# Patient Record
Sex: Female | Born: 1970 | Race: White | Hispanic: No | Marital: Married | Smoking: Never smoker
Health system: Southern US, Community
[De-identification: ages and names within clinical notes are randomized; demographics above are authoritative.]

## PROBLEM LIST (undated history)

## (undated) DIAGNOSIS — F419 Anxiety disorder, unspecified: Secondary | ICD-10-CM

## (undated) DIAGNOSIS — F32A Depression, unspecified: Secondary | ICD-10-CM

## (undated) HISTORY — PX: ABDOMINAL HYSTERECTOMY: SHX81

## (undated) HISTORY — PX: OTHER SURGICAL HISTORY: SHX169

## (undated) HISTORY — DX: Depression, unspecified: F32.A

## (undated) HISTORY — DX: Anxiety disorder, unspecified: F41.9

## (undated) HISTORY — PX: BREAST ENHANCEMENT SURGERY: SHX7

---

## 2004-06-22 ENCOUNTER — Emergency Department: Payer: Self-pay | Admitting: Emergency Medicine

## 2005-02-27 ENCOUNTER — Observation Stay: Payer: Self-pay | Admitting: Internal Medicine

## 2005-02-27 ENCOUNTER — Ambulatory Visit: Payer: Self-pay | Admitting: Internal Medicine

## 2005-04-18 ENCOUNTER — Ambulatory Visit: Payer: Self-pay | Admitting: Unknown Physician Specialty

## 2005-04-27 ENCOUNTER — Ambulatory Visit: Payer: Self-pay | Admitting: Unknown Physician Specialty

## 2005-05-25 ENCOUNTER — Ambulatory Visit: Payer: Self-pay | Admitting: Unknown Physician Specialty

## 2005-06-25 ENCOUNTER — Ambulatory Visit: Payer: Self-pay | Admitting: Unknown Physician Specialty

## 2005-07-25 ENCOUNTER — Ambulatory Visit: Payer: Self-pay | Admitting: Unknown Physician Specialty

## 2005-08-25 ENCOUNTER — Ambulatory Visit: Payer: Self-pay | Admitting: Unknown Physician Specialty

## 2006-09-10 ENCOUNTER — Ambulatory Visit: Payer: Self-pay | Admitting: Unknown Physician Specialty

## 2006-09-25 ENCOUNTER — Ambulatory Visit: Payer: Self-pay | Admitting: Unknown Physician Specialty

## 2006-10-26 ENCOUNTER — Ambulatory Visit: Payer: Self-pay | Admitting: Unknown Physician Specialty

## 2008-03-17 ENCOUNTER — Ambulatory Visit: Payer: Self-pay

## 2008-04-08 ENCOUNTER — Ambulatory Visit: Payer: Self-pay

## 2009-01-11 ENCOUNTER — Ambulatory Visit: Payer: Self-pay

## 2009-01-12 ENCOUNTER — Ambulatory Visit: Payer: Self-pay

## 2010-11-03 ENCOUNTER — Ambulatory Visit: Payer: Self-pay

## 2014-03-27 DIAGNOSIS — F3342 Major depressive disorder, recurrent, in full remission: Secondary | ICD-10-CM | POA: Insufficient documentation

## 2015-03-28 HISTORY — PX: AUGMENTATION MAMMAPLASTY: SUR837

## 2017-03-27 HISTORY — PX: AUGMENTATION MAMMAPLASTY: SUR837

## 2018-03-08 DIAGNOSIS — M5412 Radiculopathy, cervical region: Secondary | ICD-10-CM | POA: Insufficient documentation

## 2018-03-08 DIAGNOSIS — Z0000 Encounter for general adult medical examination without abnormal findings: Secondary | ICD-10-CM | POA: Insufficient documentation

## 2018-03-08 DIAGNOSIS — E8941 Symptomatic postprocedural ovarian failure: Secondary | ICD-10-CM | POA: Insufficient documentation

## 2019-03-14 DIAGNOSIS — F411 Generalized anxiety disorder: Secondary | ICD-10-CM | POA: Insufficient documentation

## 2019-04-21 DIAGNOSIS — E7849 Other hyperlipidemia: Secondary | ICD-10-CM | POA: Insufficient documentation

## 2020-01-28 ENCOUNTER — Other Ambulatory Visit: Payer: Self-pay

## 2020-01-30 ENCOUNTER — Other Ambulatory Visit: Payer: Self-pay

## 2020-01-30 ENCOUNTER — Ambulatory Visit (INDEPENDENT_AMBULATORY_CARE_PROVIDER_SITE_OTHER): Payer: Commercial Managed Care - PPO

## 2020-01-30 ENCOUNTER — Ambulatory Visit: Payer: Commercial Managed Care - PPO | Admitting: Nurse Practitioner

## 2020-01-30 ENCOUNTER — Encounter: Payer: Self-pay | Admitting: Nurse Practitioner

## 2020-01-30 VITALS — BP 130/80 | HR 89 | Temp 98.1°F | Ht 63.0 in | Wt 122.0 lb

## 2020-01-30 DIAGNOSIS — D229 Melanocytic nevi, unspecified: Secondary | ICD-10-CM | POA: Diagnosis not present

## 2020-01-30 DIAGNOSIS — M25551 Pain in right hip: Secondary | ICD-10-CM | POA: Diagnosis not present

## 2020-01-30 DIAGNOSIS — Z8371 Family history of colonic polyps: Secondary | ICD-10-CM | POA: Diagnosis not present

## 2020-01-30 DIAGNOSIS — G8929 Other chronic pain: Secondary | ICD-10-CM | POA: Diagnosis not present

## 2020-01-30 DIAGNOSIS — Z0000 Encounter for general adult medical examination without abnormal findings: Secondary | ICD-10-CM

## 2020-01-30 DIAGNOSIS — Z114 Encounter for screening for human immunodeficiency virus [HIV]: Secondary | ICD-10-CM

## 2020-01-30 DIAGNOSIS — Z1231 Encounter for screening mammogram for malignant neoplasm of breast: Secondary | ICD-10-CM

## 2020-01-30 LAB — B12 AND FOLATE PANEL
Folate: 13.1 ng/mL (ref >5.9)
Vitamin B-12: 554 pg/mL (ref 211–911)

## 2020-01-30 LAB — COMPREHENSIVE METABOLIC PANEL
ALT: 26 U/L (ref 0–35)
AST: 25 U/L (ref 0–37)
Albumin: 4.7 g/dL (ref 3.5–5.2)
Alkaline Phosphatase: 87 U/L (ref 39–117)
BUN: 20 mg/dL (ref 6–23)
CO2: 27 mEq/L (ref 19–32)
Calcium: 9.9 mg/dL (ref 8.4–10.5)
Chloride: 103 mEq/L (ref 96–112)
Creatinine, Ser: 1.06 mg/dL (ref 0.40–1.20)
GFR: 61.59 mL/min (ref >60.00)
Glucose, Bld: 75 mg/dL (ref 70–99)
Potassium: 4.5 mEq/L (ref 3.5–5.1)
Sodium: 140 mEq/L (ref 135–145)
Total Bilirubin: 0.5 mg/dL (ref 0.2–1.2)
Total Protein: 7.3 g/dL (ref 6.0–8.3)

## 2020-01-30 LAB — CBC WITH DIFFERENTIAL/PLATELET
Basophils Absolute: 0 10*3/uL (ref 0.0–0.1)
Basophils Relative: 0.7 % (ref 0.0–3.0)
Eosinophils Absolute: 0.2 10*3/uL (ref 0.0–0.7)
Eosinophils Relative: 3 % (ref 0.0–5.0)
HCT: 43.1 % (ref 36.0–46.0)
Hemoglobin: 14.2 g/dL (ref 12.0–15.0)
Lymphocytes Relative: 30.1 % (ref 12.0–46.0)
Lymphs Abs: 1.6 10*3/uL (ref 0.7–4.0)
MCHC: 33 g/dL (ref 30.0–36.0)
MCV: 91.5 fl (ref 78.0–100.0)
Monocytes Absolute: 0.5 10*3/uL (ref 0.1–1.0)
Monocytes Relative: 9 % (ref 3.0–12.0)
Neutro Abs: 3 10*3/uL (ref 1.4–7.7)
Neutrophils Relative %: 57.2 % (ref 43.0–77.0)
Platelets: 336 10*3/uL (ref 150.0–400.0)
RBC: 4.71 Mil/uL (ref 3.87–5.11)
RDW: 12.7 % (ref 11.5–15.5)
WBC: 5.2 10*3/uL (ref 4.0–10.5)

## 2020-01-30 LAB — LIPID PANEL
Cholesterol: 247 mg/dL — ABNORMAL HIGH (ref 0–200)
HDL: 69.9 mg/dL (ref >39.00)
LDL Cholesterol: 162 mg/dL — ABNORMAL HIGH (ref 0–99)
NonHDL: 176.91
Total CHOL/HDL Ratio: 4
Triglycerides: 76 mg/dL (ref 0.0–149.0)
VLDL: 15.2 mg/dL (ref 0.0–40.0)

## 2020-01-30 LAB — HEMOGLOBIN A1C: Hgb A1c MFr Bld: 5.8 % (ref 4.6–6.5)

## 2020-01-30 LAB — VITAMIN D 25 HYDROXY (VIT D DEFICIENCY, FRACTURES): VITD: 32.06 ng/mL (ref 30.00–100.00)

## 2020-01-30 LAB — TSH: TSH: 1.31 u[IU]/mL (ref 0.35–4.50)

## 2020-01-30 NOTE — Patient Instructions (Addendum)
Please go to the lab and x-ray today.  We will call you when all the results are complete.  For your history of as needed clonazepam, we would request that you sign a controlled substance contract with [place].  We would request office visit or video visit at 59-monthintervals for controlled substance prescription refill.  Some patients find that using a substitute for mild anxiety such as hydroxyzine at bedtime can work well.   We can continue to fill your Wellbutrin.   You are eligible for a shingles vaccine, flu vaccine, and Covid vaccine.  We can provide the shingles vaccine and the flu vaccine in the office.  You are up-to-date on your tetanus vaccine.  The Covid vaccine can be given through your pharmacy.  We do recommend that you update these vaccines.  You were counseled regarding the signs of mild Covid and to seek testing if having symptoms.  Please call and schedule your 3D mammogram as discussed. [id number] [place] Right [place]: Since it is not painful, we will just advise you to monitor the response to excess activity with running, squatting, marathon training.  Further recommendations pending x-ray results.    Hip Pain The hip is the joint between the upper legs and the lower pelvis. The bones, cartilage, tendons, and muscles of your hip joint support your body and allow you to move around. Hip pain can range from a minor ache to severe pain in one or both of your hips. The pain may be felt on the inside of the hip joint near the groin, or on the outside near the buttocks and upper thigh. You may also have swelling or stiffness in your hip area. Follow these instructions at home: Managing pain, stiffness, and swelling      If directed, put ice on the painful area. To do this: ? Put ice in a plastic bag. ? Place a towel between your skin and the bag. ? Leave the ice on for 20 minutes, 2-3 times a day.  If  directed, apply heat to the affected area as often as told by your health care provider. Use the heat source that your health care provider recommends, such as a moist heat pack or a heating pad. ? Place a towel between your skin and the heat source. ? Leave the heat on for 20-30 minutes. ? Remove the heat if your skin turns bright red. This is especially important if you are unable to feel pain, heat, or cold. You may have a greater risk of getting burned. Activity  Do exercises as told by your health care provider.  Avoid activities that cause pain. General instructions   Take over-the-counter and prescription medicines only as told by your health care provider.  Keep a journal of your symptoms. Write down: ? How often you have hip pain. ? The location of your pain. ? What the pain feels like. ? What makes the pain worse.  Sleep with a pillow between your legs on your most comfortable side.  Keep all follow-up visits as told by your health care provider. This is important. Contact a health care provider if:  You cannot put weight on your leg.  Your pain or swelling continues or gets worse after one week.  It gets harder to walk.  You have a fever. Get help right away if:  You fall.  You have a sudden increase in pain and swelling in your hip.  Your  hip is red or swollen or very tender to touch. Summary  Hip pain can range from a minor ache to severe pain in one or both of your hips.  The pain may be felt on the inside of the hip joint near the groin, or on the outside near the buttocks and upper thigh.  Avoid activities that cause pain.  Write down how often you have hip pain, the location of the pain, what makes it worse, and what it feels like. This information is not intended to replace advice given to you by your health care provider. Make sure you discuss any questions you have with your health care provider. Document Revised: 07/29/2018 Document Reviewed:  07/29/2018 Elsevier Patient Education  2020 Elsevier Inc.  Preventive Care 71-48 Years Old, Female Preventive care refers to visits with your health care provider and lifestyle choices that can promote health and wellness. This includes:  A yearly physical exam. This may also be called an annual well check.  Regular dental visits and eye exams.  Immunizations.  Screening for certain conditions.  Healthy lifestyle choices, such as eating a healthy diet, getting regular exercise, not using drugs or products that contain nicotine and tobacco, and limiting alcohol use. What can I expect for my preventive care visit? Physical exam Your health care provider will check your:  Height and weight. This may be used to calculate body mass index (BMI), which tells if you are at a healthy weight.  Heart rate and blood pressure.  Skin for abnormal spots. Counseling Your health care provider may ask you questions about your:  Alcohol, tobacco, and drug use.  Emotional well-being.  Home and relationship well-being.  Sexual activity.  Eating habits.  Work and work Statistician.  Method of birth control.  Menstrual cycle.  Pregnancy history. What immunizations do I need?  Influenza (flu) vaccine  This is recommended every year. Tetanus, diphtheria, and pertussis (Tdap) vaccine  You may need a Td booster every 10 years. Varicella (chickenpox) vaccine  You may need this if you have not been vaccinated. Zoster (shingles) vaccine  You may need this after age 65. Measles, mumps, and rubella (MMR) vaccine  You may need at least one dose of MMR if you were born in 1957 or later. You may also need a second dose. Pneumococcal conjugate (PCV13) vaccine  You may need this if you have certain conditions and were not previously vaccinated. Pneumococcal polysaccharide (PPSV23) vaccine  You may need one or two doses if you smoke cigarettes or if you have certain  conditions. Meningococcal conjugate (MenACWY) vaccine  You may need this if you have certain conditions. Hepatitis A vaccine  You may need this if you have certain conditions or if you travel or work in places where you may be exposed to hepatitis A. Hepatitis B vaccine  You may need this if you have certain conditions or if you travel or work in places where you may be exposed to hepatitis B. Haemophilus influenzae type b (Hib) vaccine  You may need this if you have certain conditions. Human papillomavirus (HPV) vaccine  If recommended by your health care provider, you may need three doses over 6 months. You may receive vaccines as individual doses or as more than one vaccine together in one shot (combination vaccines). Talk with your health care provider about the risks and benefits of combination vaccines. What tests do I need? Blood tests  Lipid and cholesterol levels. These may be checked every 5 years, or  more frequently if you are over 68 years old.  Hepatitis C test.  Hepatitis B test. Screening  Lung cancer screening. You may have this screening every year starting at age 4 if you have a 30-pack-year history of smoking and currently smoke or have quit within the past 15 years.  Colorectal cancer screening. All adults should have this screening starting at age 37 and continuing until age 70. Your health care provider may recommend screening at age 71 if you are at increased risk. You will have tests every 1-10 years, depending on your results and the type of screening test.  Diabetes screening. This is done by checking your blood sugar (glucose) after you have not eaten for a while (fasting). You may have this done every 1-3 years.  Mammogram. This may be done every 1-2 years. Talk with your health care provider about when you should start having regular mammograms. This may depend on whether you have a family history of breast cancer.  BRCA-related cancer screening. This  may be done if you have a family history of breast, ovarian, tubal, or peritoneal cancers.  Pelvic exam and Pap test. This may be done every 3 years starting at age 15. Starting at age 71, this may be done every 5 years if you have a Pap test in combination with an HPV test. Other tests  Sexually transmitted disease (STD) testing.  Bone density scan. This is done to screen for osteoporosis. You may have this scan if you are at high risk for osteoporosis. Follow these instructions at home: Eating and drinking  Eat a diet that includes fresh fruits and vegetables, whole grains, lean protein, and low-fat dairy.  Take vitamin and mineral supplements as recommended by your health care provider.  Do not drink alcohol if: ? Your health care provider tells you not to drink. ? You are pregnant, may be pregnant, or are planning to become pregnant.  If you drink alcohol: ? Limit how much you have to 0-1 drink a day. ? Be aware of how much alcohol is in your drink. In the [place] one drink equals one 12 oz bottle of beer (355 mL), one 5 oz glass of wine (148 mL), or one 1 oz glass of hard liquor (44 mL). Lifestyle  Take daily care of your teeth and gums.  Stay active. Exercise for at least 30 minutes on 5 or more days each week.  Do not use any products that contain nicotine or tobacco, such as cigarettes, e-cigarettes, and chewing tobacco. If you need help quitting, ask your health care provider.  If you are sexually active, practice safe sex. Use a condom or other form of birth control (contraception) in order to prevent pregnancy and STIs (sexually transmitted infections).  If told by your health care provider, take low-dose aspirin daily starting at age 62. What's next?  Visit your health care provider once a year for a well check visit.  Ask your health care provider how often you should have your eyes and teeth checked.  Stay up to date on all vaccines. This information is not  intended to replace advice given to you by your health care provider. Make sure you discuss any questions you have with your health care provider. Document Revised: 11/22/2017 Document Reviewed: 11/22/2017 Elsevier Patient Education  2020 [employer].

## 2020-01-30 NOTE — Progress Notes (Signed)
New Patient Office Visit  Subjective:  Patient ID: Ashley Hubbard, female    DOB: 1970-12-28  Age: 49 y.o. MRN: [id number]  CC:  Chief Complaint  Patient presents with  . New Patient (Initial Visit)    establish care   HPI Ashley Hubbard is a 49 yo who prefers to be called,"Ashley Hubbard" and presents to establish care with primary care provider refills of Wellbutrin, clonazepam. She is in need of a CPE and routine screening labs and studies.   Anxiety/Depression: She started on Wellbutrin XL 300 mg when her sister died a few years ago and just has never come off. She tries stopping it in 2017 and went back on it.  She has a lot of stress caring for her aging parents as she is the only child.  She does not want to try coming off Wellbutrin at this time.  She does use clonazepam 0.5 mg once a week or less onset 2019 for airplane flight and then as needed for anxiety. She has never taken it daily and last filled 20 pills in June. [place] controlled substance database PDMP checked and no suspicious activity.    She has a sensation in right hip like a catch. She mentioned this to her past PCP in 2019 and exam was unremarkable. Her brother had bone sarcoma and his leg had gross deformity. She is a runner and notices it after activity and anytime.  She has normal range of motion, no pain, no falls.  She has described this feeling of something extra in the hip. Previously saw PT and did not find home exercise helpful and did not comply.  She has not had the hip x-rayed and would like to do that today.  Further studies with an MRI have been mentioned in 2019.  Patient presents today for complete physical.  Immunizations: Tdap up-to-date 2017.  Declines Covid vaccine.  Declines flu shot. Diet: Healthy Exercise: Patient is a exercises regularly x3-4 times per week and runs Colonoscopy: No prior Dexa: No prior Pap Smear: 2016 and Nl Hysterectomy for dysfunctional uterine bleeding 2009.  She also had  torsion with bilateral oophorectomies- separate surgeries 2015. Mammogram: Performed 2019-NL, overdue Vision: UTD Dentist: UTD Tobacco: smoked socially early adulthood- stopped age 49 Alcohol: a few times a year  Past Medical History:  Diagnosis Date  . Anxiety   . Depression     Past Surgical History:  Procedure Laterality Date  . ABDOMINAL HYSTERECTOMY    . BREAST ENHANCEMENT SURGERY    . tummy tuck      Family History  Problem Relation Age of Onset  . Arthritis Mother   . Heart disease Mother   . Hypertension Mother   . Cancer Father   . Depression Father   . Hearing loss Father   . Stroke Father   . Alcohol abuse Sister   . Early death Sister   . Cancer Brother   . Asthma Paternal Grandmother   . Diabetes Paternal Grandmother   . Stroke Paternal Grandmother   . COPD Paternal Grandfather   . Heart disease Paternal Grandfather   . Heart attack Paternal Grandfather     Social History   Socioeconomic History  . Marital status: Married    Spouse name: Not on file  . Number of children: Not on file  . Years of education: Not on file  . Highest education level: Not on file  Occupational History  . Occupation: Nurse  Tobacco Use  . Smoking  status: Never Smoker  . Smokeless tobacco: Never Used  Vaping Use  . Vaping Use: Never used  Substance and Sexual Activity  . Alcohol use: Not Currently  . Drug use: Never  . Sexual activity: Yes    Partners: Male  Other Topics Concern  . Not on file  Social History Narrative  . Not on file   Social Determinants of Health   Financial Resource Strain:   . Difficulty of Paying Living Expenses: Not on file  Food Insecurity:   . Worried About Charity fundraiser in the Last Year: Not on file  . Ran Out of Food in the Last Year: Not on file  Transportation Needs:   . Lack of Transportation (Medical): Not on file  . Lack of Transportation (Non-Medical): Not on file  Physical Activity:   . Days of Exercise per Week:  Not on file  . Minutes of Exercise per Session: Not on file  Stress:   . Feeling of Stress : Not on file  Social Connections:   . Frequency of Communication with Friends and Family: Not on file  . Frequency of Social Gatherings with Friends and Family: Not on file  . Attends Religious Services: Not on file  . Active Member of Clubs or Organizations: Not on file  . Attends Archivist Meetings: Not on file  . Marital Status: Not on file  Intimate Partner Violence:   . Fear of Current or Ex-Partner: Not on file  . Emotionally Abused: Not on file  . Physically Abused: Not on file  . Sexually Abused: Not on file    Review of Systems  Constitutional: Negative.   HENT: Negative.   Eyes: Negative.   Respiratory: Negative.   Cardiovascular: Negative.   Gastrointestinal: Negative.   Musculoskeletal:       Right hip has a lump feeling-no pain. Runs a few times a week and exorcises with squats.   Allergic/Immunologic: Negative.   Neurological: Negative.   Hematological: Negative for adenopathy. Does not bruise/bleed easily.  Psychiatric/Behavioral: Negative.     Objective:   Today's Vitals: BP 130/80 (BP Location: Left Arm, Patient Position: Sitting, Cuff Size: Normal)   Pulse 89   Temp 98.1 F (36.7 C) (Oral)   Ht 5\' 3"  (1.6 m)   Wt 122 lb (55.3 kg)   SpO2 99%   BMI 21.61 kg/m   Physical Exam Vitals reviewed. Exam conducted with a chaperone present.  Constitutional:      Appearance: She is normal weight.  HENT:     Right Ear: Tympanic membrane, ear canal and external ear normal.     Left Ear: Tympanic membrane and external ear normal.     Mouth/Throat:     Mouth: Mucous membranes are moist.     Pharynx: Oropharynx is clear.  Eyes:     Pupils: Pupils are equal, round, and reactive to light.  Cardiovascular:     Rate and Rhythm: Normal rate and regular rhythm.     Pulses: Normal pulses.     Heart sounds: Normal heart sounds.  Pulmonary:     Effort: Pulmonary  effort is normal.     Breath sounds: Normal breath sounds.  Chest:     Breasts:        Right: No swelling, inverted nipple, mass, skin change or tenderness.        Left: No swelling, inverted nipple, mass or tenderness.     Comments: Bilat breast implants and hx of lift. Healed  scars. Anon tender breasts.  Abdominal:     General: Abdomen is flat.     Palpations: Abdomen is soft.     Tenderness: There is no abdominal tenderness.  Musculoskeletal:        General: Normal range of motion.     Cervical back: Normal range of motion and neck supple.     Right hip: Normal. No deformity, tenderness, bony tenderness or crepitus. Normal range of motion. Normal strength.  Lymphadenopathy:     Cervical: No cervical adenopathy.     Upper Body:     Right upper body: No supraclavicular, axillary or pectoral adenopathy.     Left upper body: No supraclavicular, axillary or pectoral adenopathy.  Skin:    General: Skin is warm and dry.     Comments: Right ear brown nevus, left face cheek irregular brown nevis- reports growing  In size.   Neurological:     General: No focal deficit present.     Mental Status: She is alert and oriented to person, place, and time.  Psychiatric:        Mood and Affect: Mood normal.        Behavior: Behavior normal.        Thought Content: Thought content normal.        Judgment: Judgment normal.     Assessment & Plan:   Problem List Items Addressed This Visit      Other   Screening mammogram for breast cancer - Primary   Relevant Orders   MM 3D SCREEN BREAST BILATERAL   Chronic right hip pain   Relevant Medications   buPROPion (WELLBUTRIN XL) 300 MG 24 hr tablet   clonazePAM (KLONOPIN) 0.5 MG tablet   Other Relevant Orders   DG Hip Unilat W OR W/O Pelvis 2-3 Views Right (Completed)   FH: colon polyps   Relevant Orders   Ambulatory referral to Gastroenterology   Nevus   Relevant Orders   Ambulatory referral to Dermatology    Other Visit Diagnoses     Screening for HIV (human immunodeficiency virus)       Relevant Orders   HIV Antibody (routine testing w rflx) (Completed)      Outpatient Encounter Medications as of 01/30/2020  Medication Sig  . buPROPion (WELLBUTRIN XL) 300 MG 24 hr tablet Take by mouth.  . clonazePAM (KLONOPIN) 0.5 MG tablet Take 0.5 mg by mouth daily as needed.    No facility-administered encounter medications on file as of 01/30/2020.   Please go to the lab and x-ray today.  We will call you when all the results are complete.  For your history of as needed clonazepam, we would request that you sign a controlled substance contract with [place].  We would request office visit or video visit at 81-month intervals for controlled substance prescription refill.  Some patients find that using a substitute for mild anxiety such as hydroxyzine at bedtime can work well.   We can continue to refill your Wellbutrin.   You are eligible for a shingles vaccine, flu vaccine, and Covid vaccine.  We can provide the shingles vaccine and the flu vaccine in the office.  You are up-to-date on your tetanus vaccine.  The Covid vaccine can be given through your pharmacy.  We do recommend that you update these vaccines.  You were counseled regarding the signs of mild Covid and to seek testing if having symptoms.  Please call and schedule your 3D mammogram as discussed. [place]  [address] [place], [place]  Right hip: Since it is not painful, we will just advise you to monitor the response to excess activity with running, squatting, marathon training.  Further recommendations pending x-ray results.  Follow-up: Return in about 3 months (around 05/01/2020).   This visit occurred during the SARS-CoV-2 public health emergency.  Safety protocols were in place, including screening questions prior to the visit, additional usage of staff PPE, and extensive cleaning of exam room while observing appropriate  contact time as indicated for disinfecting solutions.   Denice Paradise, NP

## 2020-02-01 ENCOUNTER — Telehealth: Payer: Self-pay | Admitting: Nurse Practitioner

## 2020-02-01 ENCOUNTER — Encounter: Payer: Self-pay | Admitting: Nurse Practitioner

## 2020-02-01 DIAGNOSIS — M25551 Pain in right hip: Secondary | ICD-10-CM

## 2020-02-01 LAB — HIV ANTIBODY (ROUTINE TESTING W REFLEX): HIV 1&2 Ab, 4th Generation: NONREACTIVE

## 2020-02-01 MED ORDER — BUPROPION HCL ER (XL) 300 MG PO TB24: 300.0000 mg | ORAL_TABLET | Freq: Every day | ORAL | 1 refills | Status: AC

## 2020-02-01 NOTE — Telephone Encounter (Signed)
I LMOM for Ashley Hubbard to call the office for questions about her labs and hip Xray.I sent Ashley Hubbard a My chart note.

## 2020-02-03 ENCOUNTER — Telehealth: Payer: Self-pay | Admitting: Nurse Practitioner

## 2020-02-03 NOTE — Telephone Encounter (Signed)
An enthesophyte is a bony spur forming at a ligament or tendon insertion into the bone. It grows in the direction of the natural pull of the ligament or tendon involved. This is a response to stress. This can be associated with inflammatory arthritis. Treatment is anti-inflammatories, sometimes a cortisone injection, sometimes rest from the activity. Further investigation with MRI of the hip is recommended. I would recommend Rheumatology Consult to check for the presence of inflammatory arthritis that will require different medication if present. Also an Orthopedic consult for MRI and any further recommendations. Referral to RHEUM and ORTHO placed.

## 2020-02-05 ENCOUNTER — Telehealth: Payer: Self-pay

## 2020-02-05 NOTE — Telephone Encounter (Signed)
Contract given to [employer]

## 2020-02-05 NOTE — Telephone Encounter (Signed)
Pt came in to sign non opioid controlled substance form. I checked picture ID and placed in folder up front.

## 2020-02-09 ENCOUNTER — Telehealth (INDEPENDENT_AMBULATORY_CARE_PROVIDER_SITE_OTHER): Payer: Self-pay | Admitting: Gastroenterology

## 2020-02-09 ENCOUNTER — Other Ambulatory Visit: Payer: Self-pay

## 2020-02-09 DIAGNOSIS — Z1211 Encounter for screening for malignant neoplasm of colon: Secondary | ICD-10-CM

## 2020-02-09 DIAGNOSIS — Z83719 Family history of colon polyps, unspecified: Secondary | ICD-10-CM

## 2020-02-09 DIAGNOSIS — Z8371 Family history of colonic polyps: Secondary | ICD-10-CM

## 2020-02-09 MED ORDER — NA SULFATE-K SULFATE-MG SULF 17.5-3.13-1.6 GM/177ML PO SOLN
1.0000 | Freq: Once | ORAL | 0 refills | Status: AC
Start: 2020-02-09 — End: 2020-02-09

## 2020-02-09 NOTE — Progress Notes (Signed)
Gastroenterology Pre-Procedure Review  Request Date: Tuesday 03/23/20 Requesting Physician: Dr. Vicente Males  PATIENT REVIEW QUESTIONS: The patient responded to the following health history questions as indicated:    1. Are you having any GI issues? no 2. Do you have a personal history of Polyps? no 3. Do you have a family history of Colon Cancer or Polyps? yes (FATHER COLON POLYPS) 4. Diabetes Mellitus? no 5. Joint replacements in the past 12 months?no 6. Major health problems in the past 3 months?no 7. Any artificial heart valves, MVP, or defibrillator?no    MEDICATIONS & ALLERGIES:    Patient reports the following regarding taking any anticoagulation/antiplatelet therapy:   Plavix, Coumadin, Eliquis, Xarelto, Lovenox, Pradaxa, Brilinta, or Effient? no Aspirin? no  Patient confirms/reports the following medications:  Current Outpatient Medications  Medication Sig Dispense Refill  . buPROPion (WELLBUTRIN XL) 300 MG 24 hr tablet Take 1 tablet (300 mg total) by mouth daily. 90 tablet 1  . clonazePAM (KLONOPIN) 0.5 MG tablet Take 0.5 mg by mouth daily as needed.     . Na Sulfate-K Sulfate-Mg Sulf 17.5-3.13-1.6 GM/177ML SOLN Take 1 kit by mouth once for 1 dose. 354 mL 0   No current facility-administered medications for this visit.    Patient confirms/reports the following allergies:  Allergies  Allergen Reactions  . Penicillins Rash    Orders Placed This Encounter  Procedures  . Procedural/ Surgical Case Request: COLONOSCOPY WITH PROPOFOL    Standing Status:   Standing    Number of Occurrences:   1    Order Specific Question:   Pre-op diagnosis    Answer:   screening colonoscopy, family history of colon polyps    Order Specific Question:   CPT Code    Answer:   [phone]    AUTHORIZATION INFORMATION Primary Insurance: 1D#: Group #:  Secondary Insurance: 1D#: Group #:  SCHEDULE INFORMATION: Date: Tuesday 03/23/20 Time: Location:[facility]

## 2020-02-25 ENCOUNTER — Telehealth: Payer: Self-pay | Admitting: Nurse Practitioner

## 2020-02-25 MED ORDER — CLONAZEPAM 0.5 MG PO TABS: 0.5000 mg | ORAL_TABLET | Freq: Every day | ORAL | 0 refills | Status: AC | PRN

## 2020-02-25 NOTE — Telephone Encounter (Signed)
Patient never had her clonazePAM (KLONOPIN) 0.5 MG tablet filled and would like it be be filled. Patient came into office and already signed contract.

## 2020-02-25 NOTE — Telephone Encounter (Signed)
Refill for 30 days only.  OFFICE VISIT NEEDED prior to any more refills 

## 2020-03-02 ENCOUNTER — Ambulatory Visit: Payer: Commercial Managed Care - PPO | Admitting: Internal Medicine

## 2020-03-10 ENCOUNTER — Inpatient Hospital Stay
Admission: RE | Admit: 2020-03-10 | Discharge: 2020-03-10 | Disposition: A | Discharge status: Home / Self Care | Payer: Self-pay | Source: Ambulatory Visit | Attending: *Deleted | Admitting: *Deleted

## 2020-03-10 ENCOUNTER — Other Ambulatory Visit: Payer: Self-pay | Admitting: *Deleted

## 2020-03-10 ENCOUNTER — Other Ambulatory Visit: Payer: Self-pay | Admitting: Nurse Practitioner

## 2020-03-10 ENCOUNTER — Other Ambulatory Visit: Payer: Self-pay

## 2020-03-10 ENCOUNTER — Ambulatory Visit
Admission: RE | Admit: 2020-03-10 | Discharge: 2020-03-10 | Disposition: A | Discharge status: Home / Self Care | Payer: Managed Care, Other (non HMO) | Source: Ambulatory Visit | Attending: Nurse Practitioner | Admitting: Nurse Practitioner

## 2020-03-10 DIAGNOSIS — Z1231 Encounter for screening mammogram for malignant neoplasm of breast: Secondary | ICD-10-CM

## 2020-03-19 ENCOUNTER — Other Ambulatory Visit
Admission: RE | Admit: 2020-03-19 | Discharge: 2020-03-19 | Disposition: A | Discharge status: Home / Self Care | Payer: Managed Care, Other (non HMO) | Source: Ambulatory Visit | Attending: Gastroenterology | Admitting: Gastroenterology

## 2020-03-19 ENCOUNTER — Other Ambulatory Visit: Payer: Self-pay

## 2020-03-19 DIAGNOSIS — Z01812 Encounter for preprocedural laboratory examination: Secondary | ICD-10-CM | POA: Insufficient documentation

## 2020-03-19 DIAGNOSIS — Z20822 Contact with and (suspected) exposure to covid-19: Secondary | ICD-10-CM | POA: Insufficient documentation

## 2020-03-19 LAB — SARS CORONAVIRUS 2 (TAT 6-24 HRS): SARS Coronavirus 2: NEGATIVE

## 2020-03-22 ENCOUNTER — Encounter: Payer: Self-pay | Admitting: Gastroenterology

## 2020-03-23 ENCOUNTER — Encounter
Admission: RE | Disposition: A | Discharge status: Home / Self Care | Payer: Self-pay | Source: Home / Self Care | Attending: Gastroenterology

## 2020-03-23 ENCOUNTER — Ambulatory Visit: Payer: Managed Care, Other (non HMO) | Admitting: Certified Registered Nurse Anesthetist

## 2020-03-23 ENCOUNTER — Other Ambulatory Visit: Payer: Self-pay

## 2020-03-23 ENCOUNTER — Ambulatory Visit
Admission: RE | Admit: 2020-03-23 | Discharge: 2020-03-23 | Disposition: A | Discharge status: Home / Self Care | Payer: Managed Care, Other (non HMO) | Attending: Gastroenterology | Admitting: Gastroenterology

## 2020-03-23 ENCOUNTER — Encounter: Payer: Self-pay | Admitting: Gastroenterology

## 2020-03-23 DIAGNOSIS — Z1211 Encounter for screening for malignant neoplasm of colon: Secondary | ICD-10-CM | POA: Insufficient documentation

## 2020-03-23 DIAGNOSIS — Z79899 Other long term (current) drug therapy: Secondary | ICD-10-CM | POA: Insufficient documentation

## 2020-03-23 DIAGNOSIS — Z8371 Family history of colonic polyps: Secondary | ICD-10-CM | POA: Diagnosis not present

## 2020-03-23 HISTORY — PX: COLONOSCOPY WITH PROPOFOL: SHX5780

## 2020-03-23 SURGERY — COLONOSCOPY WITH PROPOFOL
Anesthesia: General

## 2020-03-23 MED ORDER — PROPOFOL 500 MG/50ML IV EMUL
INTRAVENOUS | Status: AC
  Filled 2020-03-23: qty 50

## 2020-03-23 MED ORDER — PROPOFOL 500 MG/50ML IV EMUL
INTRAVENOUS | Status: DC | PRN
  Administered 2020-03-23: 160 ug/kg/min via INTRAVENOUS

## 2020-03-23 MED ORDER — LIDOCAINE HCL (PF) 2 % IJ SOLN
INTRAMUSCULAR | Status: AC
  Filled 2020-03-23: qty 5

## 2020-03-23 MED ORDER — LIDOCAINE HCL (CARDIAC) PF 100 MG/5ML IV SOSY
PREFILLED_SYRINGE | INTRAVENOUS | Status: DC | PRN
  Administered 2020-03-23: 50 mg via INTRAVENOUS

## 2020-03-23 MED ORDER — SODIUM CHLORIDE 0.9 % IV SOLN: INTRAVENOUS | Status: DC

## 2020-03-23 MED ORDER — PROPOFOL 10 MG/ML IV BOLUS
INTRAVENOUS | Status: DC | PRN
Start: 2020-03-23 — End: 2020-03-23
  Administered 2020-03-23: 40 mg via INTRAVENOUS
  Administered 2020-03-23: 60 mg via INTRAVENOUS

## 2020-03-23 NOTE — Anesthesia Preprocedure Evaluation (Signed)
Anesthesia Evaluation  Patient identified by MRN, date of birth, ID band Patient awake    Reviewed: Allergy & Precautions, NPO status , Patient's Chart, lab work & pertinent test results  History of Anesthesia Complications Negative for: history of anesthetic complications  Airway Mallampati: II       Dental   Pulmonary neg sleep apnea, neg COPD, Not current smoker,           Cardiovascular (-) hypertension(-) Past MI and (-) CHF (-) dysrhythmias (-) Valvular Problems/Murmurs     Neuro/Psych neg Seizures Anxiety Depression    GI/Hepatic Neg liver ROS, neg GERD  ,  Endo/Other  neg diabetes  Renal/GU negative Renal ROS     Musculoskeletal   Abdominal   Peds  Hematology   Anesthesia Other Findings   Reproductive/Obstetrics                             Anesthesia Physical Anesthesia Plan  ASA: II  Anesthesia Plan: General   Post-op Pain Management:    Induction: Intravenous  PONV Risk Score and Plan: 3 and Propofol infusion, TIVA and Treatment may vary due to age or medical condition  Airway Management Planned: Nasal Cannula  Additional Equipment:   Intra-op Plan:   Post-operative Plan:   Informed Consent: I have reviewed the patients History and Physical, chart, labs and discussed the procedure including the risks, benefits and alternatives for the proposed anesthesia with the patient or authorized representative who has indicated his/her understanding and acceptance.       Plan Discussed with:   Anesthesia Plan Comments:         Anesthesia Quick Evaluation

## 2020-03-23 NOTE — Progress Notes (Signed)
   03/23/20 0810  Clinical Encounter Type  Visited With Family  Visit Type Initial  Referral From Chaplain  Consult/Referral To Chaplain  While rounding [facility] waiting area, chaplain visited with Pt's husband and he said all is well and that he was doing fine.

## 2020-03-23 NOTE — H&P (Signed)
Wyline Mood, MD [address], [address], [place], [place], [address] [address], [address], [place], [place], [address] Phone: [phone]  Fax: [phone]  Primary Care Physician:  Theadore Nan, NP   Pre-Procedure History & Physical: HPI:  Ashley Hubbard is a 49 y.o. female is here for an colonoscopy.   Past Medical History:  Diagnosis Date  . Anxiety   . Depression     Past Surgical History:  Procedure Laterality Date  . ABDOMINAL HYSTERECTOMY    . AUGMENTATION MAMMAPLASTY Bilateral 2017  . AUGMENTATION MAMMAPLASTY Bilateral 2019   same implants, pt stated she had "breast lift/tissue removed"-has same implants per pt  . BREAST ENHANCEMENT SURGERY    . tummy tuck      Prior to Admission medications   Medication Sig Start Date End Date Taking? Authorizing Provider  buPROPion (WELLBUTRIN XL) 300 MG 24 hr tablet Take 1 tablet (300 mg total) by mouth daily. 02/01/20 07/30/20 Yes Theadore Nan, NP  clonazePAM (KLONOPIN) 0.5 MG tablet Take 1 tablet (0.5 mg total) by mouth daily as needed. 02/25/20  Yes Sherlene Shams, MD    Allergies as of 02/09/2020 - Review Complete 02/09/2020  Allergen Reaction Noted  . Penicillins Rash 06/25/2013    Family History  Problem Relation Age of Onset  . Arthritis Mother   . Heart disease Mother   . Hypertension Mother   . Cancer Father   . Depression Father   . Hearing loss Father   . Stroke Father   . Alcohol abuse Sister   . Early death Sister   . Cancer Brother   . Asthma Paternal Grandmother   . Diabetes Paternal Grandmother   . Stroke Paternal Grandmother   . COPD Paternal Grandfather   . Heart disease Paternal Grandfather   . Heart attack Paternal Grandfather   . Breast cancer Neg Hx     Social History   Socioeconomic History  . Marital status: Married    Spouse name: Not on file  . Number of children: Not on file  . Years of education: Not on file  . Highest education level: Not on file   Occupational History  . Occupation: Nurse  Tobacco Use  . Smoking status: Never Smoker  . Smokeless tobacco: Never Used  Vaping Use  . Vaping Use: Never used  Substance and Sexual Activity  . Alcohol use: Not Currently  . Drug use: Never  . Sexual activity: Yes    Partners: Male  Other Topics Concern  . Not on file  Social History Narrative  . Not on file   Social Determinants of Health   Financial Resource Strain: Not on file  Food Insecurity: Not on file  Transportation Needs: Not on file  Physical Activity: Not on file  Stress: Not on file  Social Connections: Not on file  Intimate Partner Violence: Not on file    Review of Systems: See HPI, otherwise negative ROS  Physical Exam: BP 105/84   Pulse 94   Temp 97.7 F (36.5 C) (Temporal)   Resp 18   Ht 5\' 3"  (1.6 m)   Wt 53.5 kg   SpO2 100%   BMI 20.90 kg/m  General:   Alert,  pleasant and cooperative in NAD Head:  Normocephalic and atraumatic. Neck:  Supple; no masses or thyromegaly. Lungs:  Clear throughout to auscultation, normal respiratory effort.    Heart:  +S1, +S2, Regular rate and rhythm, No edema. Abdomen:  Soft,  nontender and nondistended. Normal bowel sounds, without guarding, and without rebound.   Neurologic:  Alert and  oriented x4;  grossly normal neurologically.  Impression/Plan: Ashley Hubbard is here for an colonoscopy to be performed for Screening colonoscopy with father having colon polyps Risks, benefits, limitations, and alternatives regarding  colonoscopy have been reviewed with the patient.  Questions have been answered.  All parties agreeable.   Jonathon Bellows, MD  03/23/2020, 8:04 AM

## 2020-03-23 NOTE — Op Note (Signed)
[facility] Gastroenterology Patient Name: Ashley Hubbard Procedure Date: 03/23/2020 8:06 AM MRN: [id number] Account #: [id number] Date of Birth: 1970/05/25 Admit Type: Outpatient Age: 49 Room: [facility] ENDO ROOM 2 Gender: Female Note Status: Finalized Procedure:             Colonoscopy Indications:           Colon cancer screening in patient at increased risk:                         Family history of 1st-degree relative with colon polyps Providers:             Jonathon Bellows MD, MD Referring MD:          Luane School. Millsaps (Referring MD) Medicines:             Monitored Anesthesia Care Complications:         No immediate complications. Procedure:             Pre-Anesthesia Assessment:                        - Prior to the procedure, a History and Physical was                         performed, and patient medications, allergies and                         sensitivities were reviewed. The patient's tolerance                         of previous anesthesia was reviewed.                        - The risks and benefits of the procedure and the                         sedation options and risks were discussed with the                         patient. All questions were answered and informed                         consent was obtained.                        - ASA Grade Assessment: II - A patient with mild                         systemic disease.                        After obtaining informed consent, the colonoscope was                         passed under direct vision. Throughout the procedure,                         the patient's blood pressure, pulse, and oxygen                         saturations  were monitored continuously. The                         Colonoscope was introduced through the anus and                         advanced to the the cecum, identified by the                         appendiceal orifice. The colonoscopy was performed                          with ease. The patient tolerated the procedure well.                         The quality of the bowel preparation was excellent. Findings:      The perianal and digital rectal examinations were normal.      The entire examined colon appeared normal on direct and retroflexion       views. Impression:            - The entire examined colon is normal on direct and                         retroflexion views.                        - No specimens collected. Recommendation:        - Discharge patient to home (with escort).                        - Resume previous diet.                        - Continue present medications.                        - Repeat colonoscopy in 5 years for screening purposes. Procedure Code(s):     --- Professional ---                        [id number], Colonoscopy, flexible; diagnostic, including                         collection of specimen(s) by brushing or washing, when                         performed (separate procedure) Diagnosis Code(s):     --- Professional ---                        Z83.71, Family history of colonic polyps CPT copyright 2019 American Medical Association. All rights reserved. The codes documented in this report are preliminary and upon coder review may  be revised to meet current compliance requirements. Wyline Mood, MD Wyline Mood MD, MD 03/23/2020 8:27:34 AM This report has been signed electronically. Number of Addenda: 0 Note Initiated On: 03/23/2020 8:06 AM Scope Withdrawal Time: 0 hours 9 minutes 26 seconds  Total Procedure Duration: 0 hours 15 minutes 7 seconds  Estimated Blood Loss:  Estimated blood loss: none.      [facility]

## 2020-03-23 NOTE — Transfer of Care (Signed)
Immediate Anesthesia Transfer of Care Note  Patient: Ashley Hubbard  Procedure(s) Performed: COLONOSCOPY WITH PROPOFOL (N/A )  Patient Location: PACU  Anesthesia Type:General  Level of Consciousness: drowsy  Airway & Oxygen Therapy: Patient Spontanous Breathing  Post-op Assessment: Report given to RN and Post -op Vital signs reviewed and stable  Post vital signs: Reviewed and stable  Last Vitals:  Vitals Value Taken Time  BP 103/65 03/23/20 0829  Temp 36 C 03/23/20 0828  Pulse    Resp 20 03/23/20 0829  SpO2    Vitals shown include unvalidated device data.  Last Pain:  Vitals:   03/23/20 0828  TempSrc: Temporal  PainSc: Asleep         Complications: No complications documented.

## 2020-03-23 NOTE — Anesthesia Postprocedure Evaluation (Signed)
Anesthesia Post Note  Patient: Ashley Hubbard  Procedure(s) Performed: COLONOSCOPY WITH PROPOFOL (N/A )  Patient location during evaluation: Endoscopy Anesthesia Type: General Level of consciousness: awake and alert Pain management: pain level controlled Vital Signs Assessment: post-procedure vital signs reviewed and stable Respiratory status: spontaneous breathing and respiratory function stable Cardiovascular status: stable Anesthetic complications: no   No complications documented.   Last Vitals:  Vitals:   03/23/20 0700 03/23/20 0828  BP: 105/84 103/65  Pulse: 94 74  Resp: 18   Temp: 36.5 C (!) 36 C  SpO2: 100% 100%    Last Pain:  Vitals:   03/23/20 0848  TempSrc:   PainSc: 0-No pain                 [name]

## 2020-03-24 ENCOUNTER — Encounter: Payer: Self-pay | Admitting: Gastroenterology

## 2020-07-12 ENCOUNTER — Ambulatory Visit: Payer: Commercial Managed Care - PPO | Admitting: Dermatology

## 2020-08-25 ENCOUNTER — Other Ambulatory Visit: Payer: Self-pay

## 2020-09-15 ENCOUNTER — Encounter: Payer: Self-pay | Admitting: Nurse Practitioner

## 2020-09-15 NOTE — Telephone Encounter (Signed)
Patient taking Clonazepam only when flying and for anxiety occasionally last refill was by provider on 03/09/20 for 30 patient requesting refill until appointment with new NP Flinchum on 12/16/20 your last note staed no refills with out appointment.

## 2020-09-15 NOTE — Telephone Encounter (Signed)
Refill denied. The last refill was done as a courtesy and and office visit was needed prior to any additional refills

## 2020-09-15 NOTE — Addendum Note (Signed)
Addended by: Nanci Pina on: 09/15/2020 03:57 PM   Modules accepted: Orders

## 2020-09-15 NOTE — Telephone Encounter (Signed)
Pt called to schedule a TOC appt first available was 12/16/20. She wanted to know if she could havae the Klonopin refilled til then

## 2020-09-16 NOTE — Telephone Encounter (Signed)
Patient aware medication cannot be filled until appointment.

## 2020-12-16 ENCOUNTER — Encounter: Payer: Managed Care, Other (non HMO) | Admitting: Adult Health

## 2021-02-14 ENCOUNTER — Other Ambulatory Visit: Payer: Self-pay | Admitting: Internal Medicine

## 2021-02-14 DIAGNOSIS — Z1231 Encounter for screening mammogram for malignant neoplasm of breast: Secondary | ICD-10-CM

## 2021-03-22 ENCOUNTER — Other Ambulatory Visit: Payer: Self-pay

## 2021-03-22 ENCOUNTER — Ambulatory Visit
Admission: RE | Admit: 2021-03-22 | Discharge: 2021-03-22 | Disposition: A | Discharge status: Home / Self Care | Payer: Managed Care, Other (non HMO) | Source: Ambulatory Visit | Attending: Internal Medicine | Admitting: Internal Medicine

## 2021-03-22 DIAGNOSIS — Z1231 Encounter for screening mammogram for malignant neoplasm of breast: Secondary | ICD-10-CM | POA: Diagnosis present

## 2021-03-29 ENCOUNTER — Other Ambulatory Visit: Payer: Self-pay | Admitting: Internal Medicine

## 2021-03-29 DIAGNOSIS — R928 Other abnormal and inconclusive findings on diagnostic imaging of breast: Secondary | ICD-10-CM

## 2021-03-29 DIAGNOSIS — R921 Mammographic calcification found on diagnostic imaging of breast: Secondary | ICD-10-CM

## 2021-04-08 ENCOUNTER — Other Ambulatory Visit: Payer: Self-pay

## 2021-04-08 ENCOUNTER — Ambulatory Visit
Admission: RE | Admit: 2021-04-08 | Discharge: 2021-04-08 | Disposition: A | Discharge status: Home / Self Care | Payer: Managed Care, Other (non HMO) | Source: Ambulatory Visit | Attending: Internal Medicine | Admitting: Internal Medicine

## 2021-04-08 DIAGNOSIS — R921 Mammographic calcification found on diagnostic imaging of breast: Secondary | ICD-10-CM | POA: Diagnosis present

## 2021-04-08 DIAGNOSIS — R928 Other abnormal and inconclusive findings on diagnostic imaging of breast: Secondary | ICD-10-CM | POA: Insufficient documentation

## 2021-04-11 ENCOUNTER — Other Ambulatory Visit: Payer: Self-pay | Admitting: Internal Medicine

## 2021-04-11 DIAGNOSIS — R921 Mammographic calcification found on diagnostic imaging of breast: Secondary | ICD-10-CM

## 2021-10-07 ENCOUNTER — Ambulatory Visit
Admission: RE | Admit: 2021-10-07 | Discharge: 2021-10-07 | Disposition: A | Discharge status: Home / Self Care | Payer: Managed Care, Other (non HMO) | Source: Ambulatory Visit | Attending: Internal Medicine | Admitting: Internal Medicine

## 2021-10-07 DIAGNOSIS — R921 Mammographic calcification found on diagnostic imaging of breast: Secondary | ICD-10-CM | POA: Insufficient documentation

## 2021-10-12 ENCOUNTER — Other Ambulatory Visit: Payer: Self-pay | Admitting: Internal Medicine

## 2021-10-12 DIAGNOSIS — R921 Mammographic calcification found on diagnostic imaging of breast: Secondary | ICD-10-CM

## 2022-04-11 ENCOUNTER — Ambulatory Visit
Admission: RE | Admit: 2022-04-11 | Discharge: 2022-04-11 | Disposition: A | Discharge status: Home / Self Care | Payer: Managed Care, Other (non HMO) | Source: Ambulatory Visit | Attending: Internal Medicine | Admitting: Internal Medicine

## 2022-04-11 DIAGNOSIS — R921 Mammographic calcification found on diagnostic imaging of breast: Secondary | ICD-10-CM | POA: Diagnosis present

## 2022-10-10 IMAGING — DX DG HIP (WITH OR WITHOUT PELVIS) 2-3V*R*
2 series · 2 of 2 positions shown · non-contrast
Comparison: None.

CLINICAL DATA: Right hip feels lump/catch, runner.

EXAM:
DG HIP (WITH OR WITHOUT PELVIS) 2-3V RIGHT

[ap frog obl (oblique)]
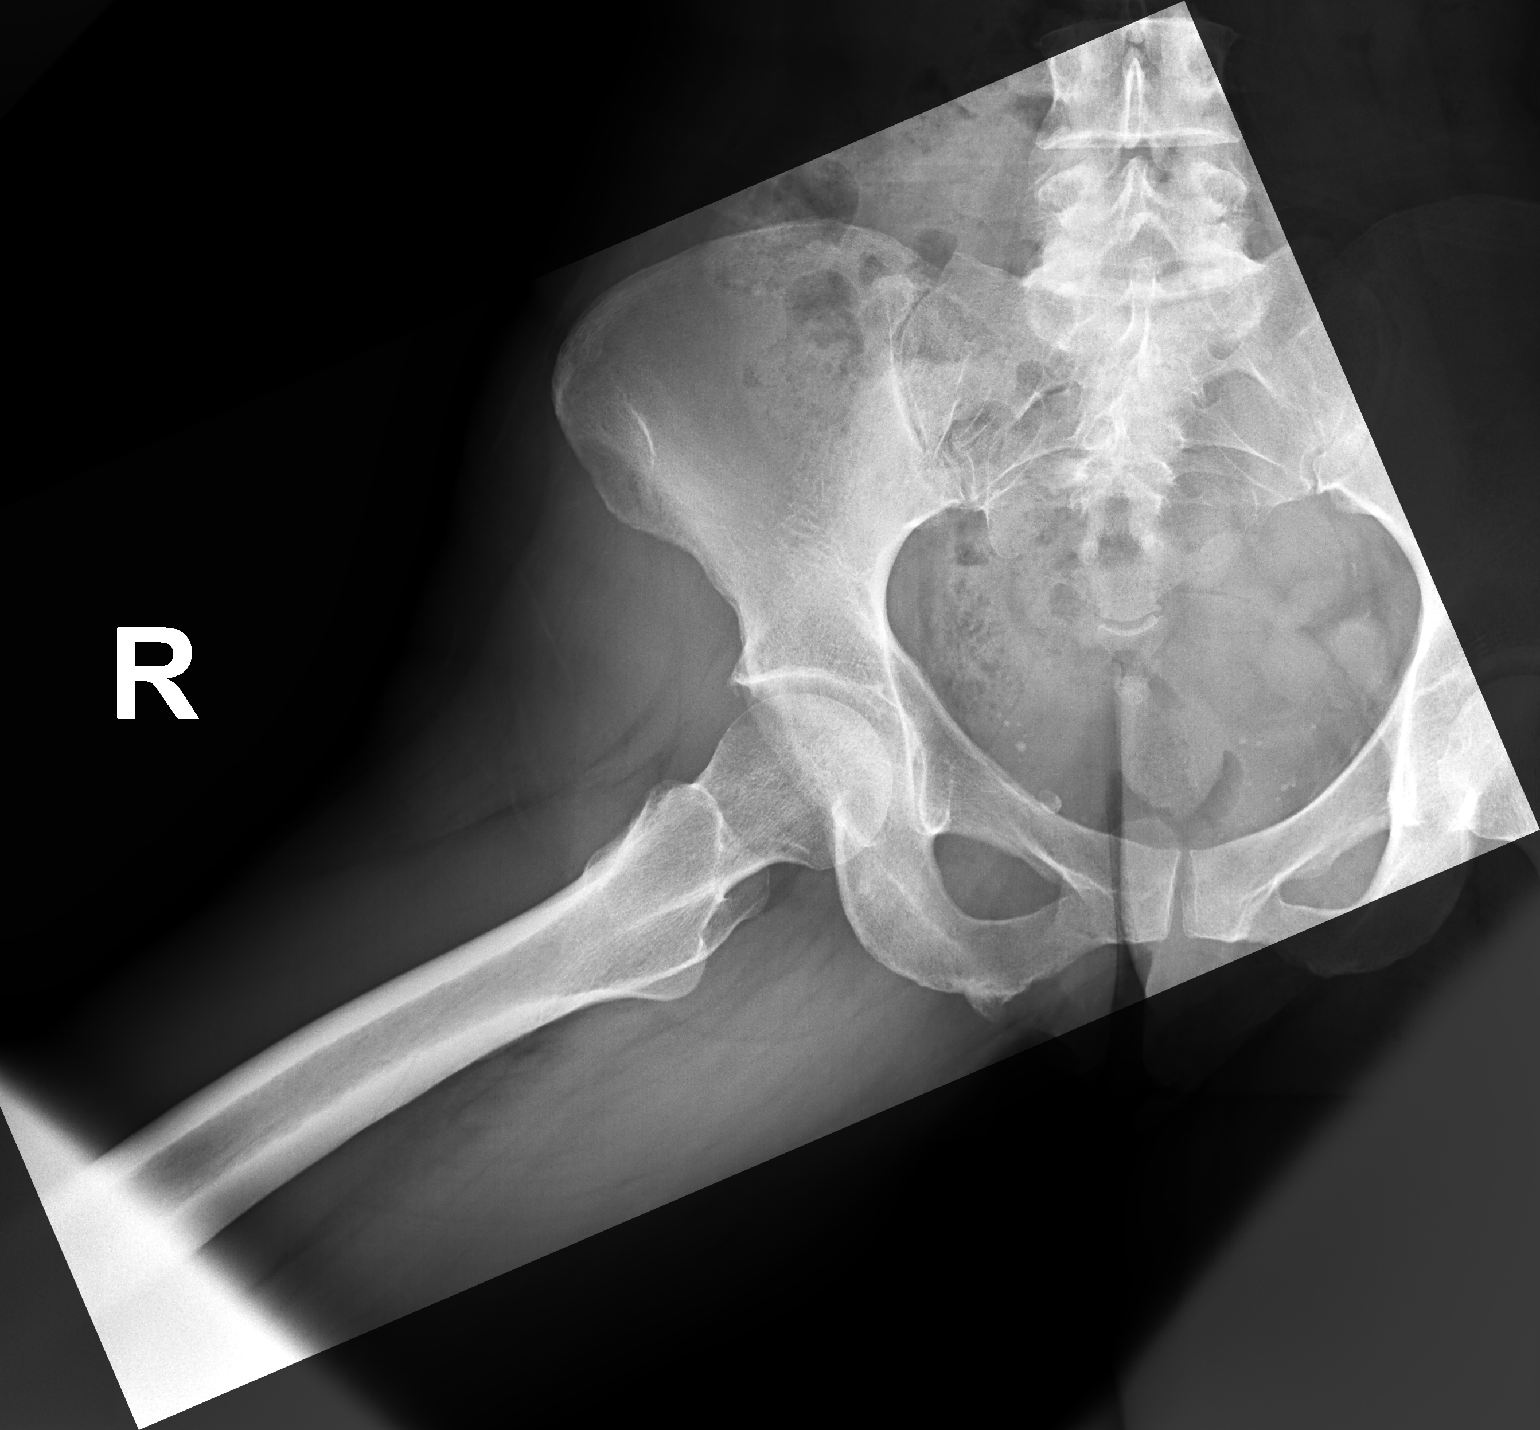

[pelvis ap]
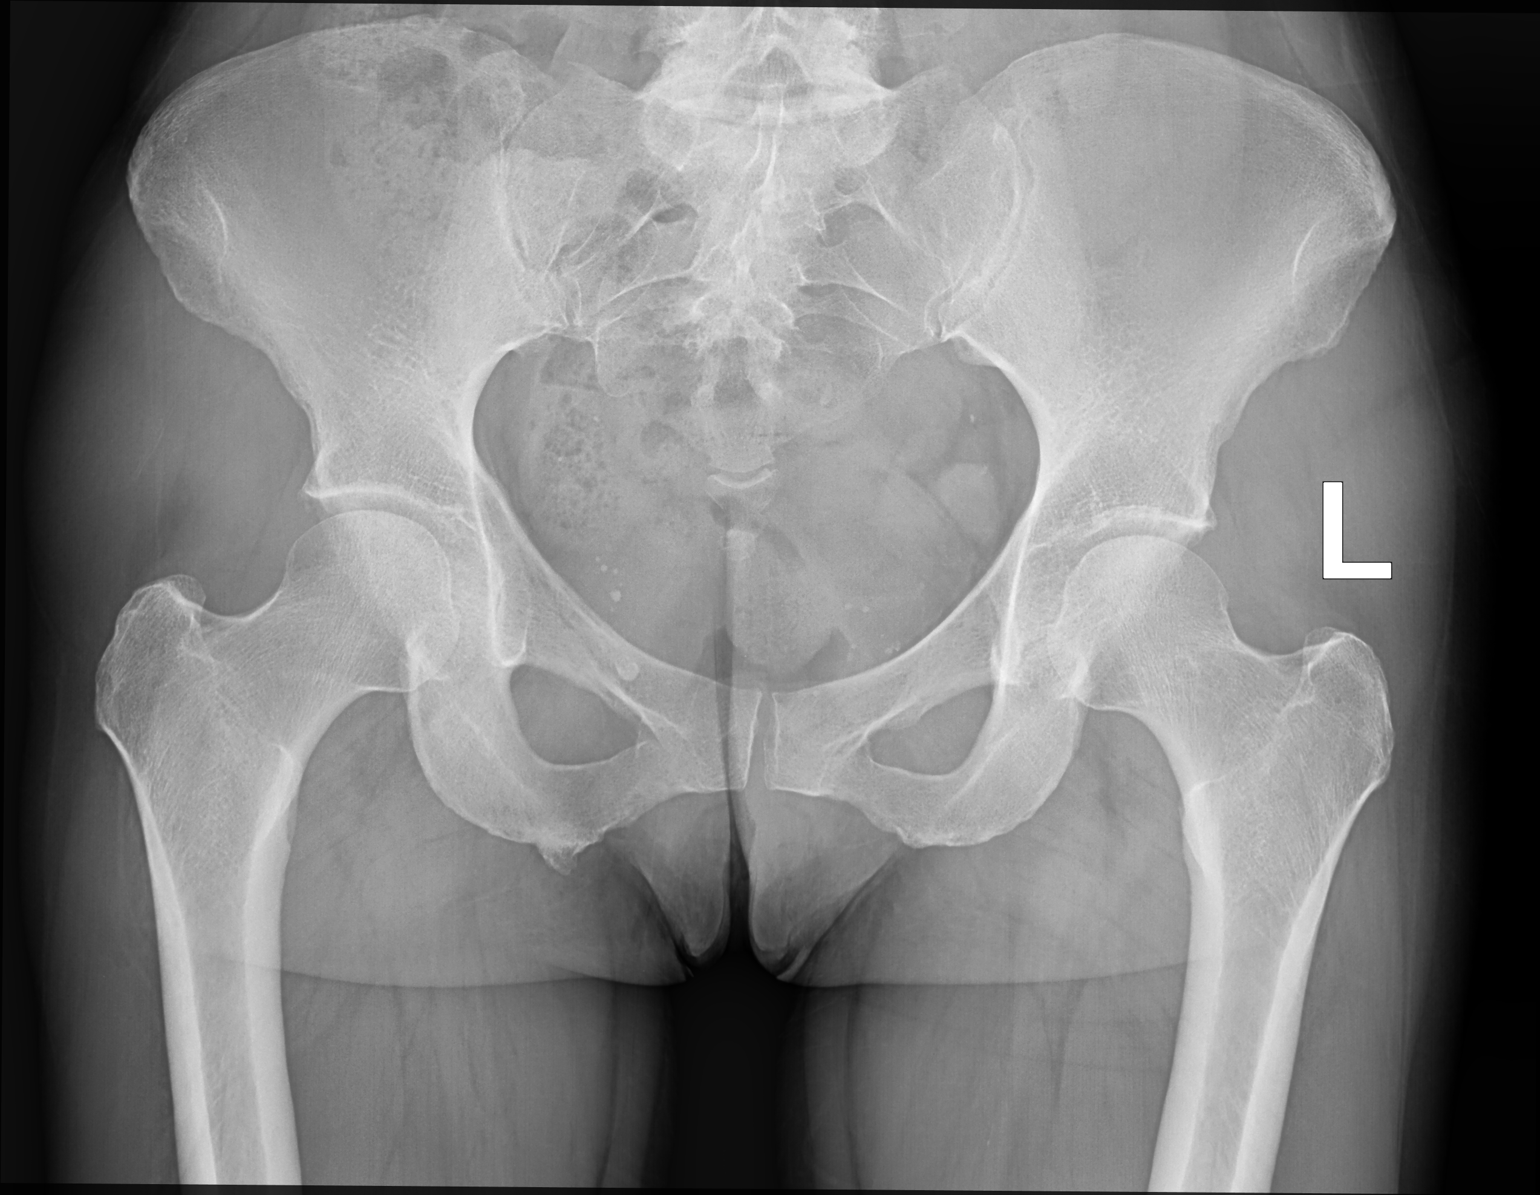

[2 of 2 positions shown; findings below may reference images not displayed]

FINDINGS: Right ischial tuberosity enthesophyte formation. There is no
evidence of hip fracture or dislocation of the right hip. Frontal
views of the left hip and pelvis are unremarkable. There is no
evidence of arthropathy or other focal bone abnormality.
IMPRESSION: 1. Right ischial tuberosity enthesophyte formation.
2. No acute displaced fracture or dislocation of the right hip.

## 2023-02-28 ENCOUNTER — Encounter: Payer: Self-pay | Admitting: Internal Medicine

## 2023-03-05 ENCOUNTER — Other Ambulatory Visit: Payer: Self-pay | Admitting: Internal Medicine

## 2023-03-05 DIAGNOSIS — R921 Mammographic calcification found on diagnostic imaging of breast: Secondary | ICD-10-CM

## 2023-04-13 ENCOUNTER — Ambulatory Visit
Admission: RE | Admit: 2023-04-13 | Discharge: 2023-04-13 | Disposition: A | Discharge status: Home / Self Care | Payer: Managed Care, Other (non HMO) | Source: Ambulatory Visit | Attending: Internal Medicine | Admitting: Internal Medicine

## 2023-04-13 DIAGNOSIS — R921 Mammographic calcification found on diagnostic imaging of breast: Secondary | ICD-10-CM | POA: Diagnosis present

## 2023-12-18 IMAGING — MG MM DIGITAL DIAGNOSTIC UNILAT*R* IMPLANT W/ TOMO W/ CAD
7 series · 8 of 11 positions shown · non-contrast
Comparison: Previous exam(s).

CLINICAL DATA: 50-year-old female presenting as a recall from
screening for right breast calcifications. Patient had breast
augmentation in 6826.

EXAM:
DIGITAL DIAGNOSTIC UNILATERAL RIGHT MAMMOGRAM WITH IMPLANTS, CAD AND
TOMOSYNTHESIS
TECHNIQUE: Right digital diagnostic mammography and breast tomosynthesis was
performed. The images were evaluated with computer-aided detection.
Standard and/or implant displaced views were performed.

[R ML (1 of 2)]
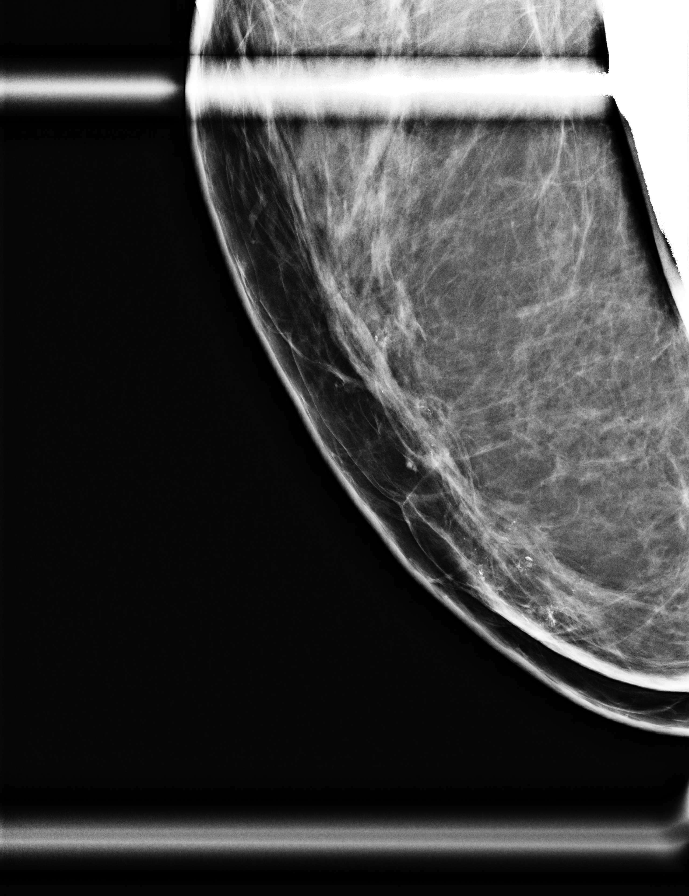

[R CC (1 of 3)]
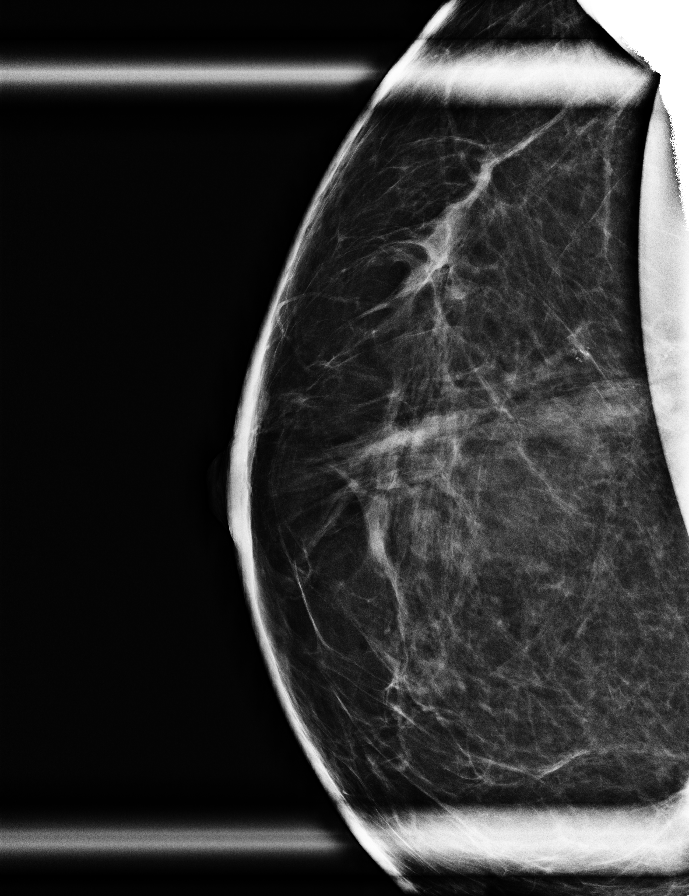

[R CC (2 of 3)]
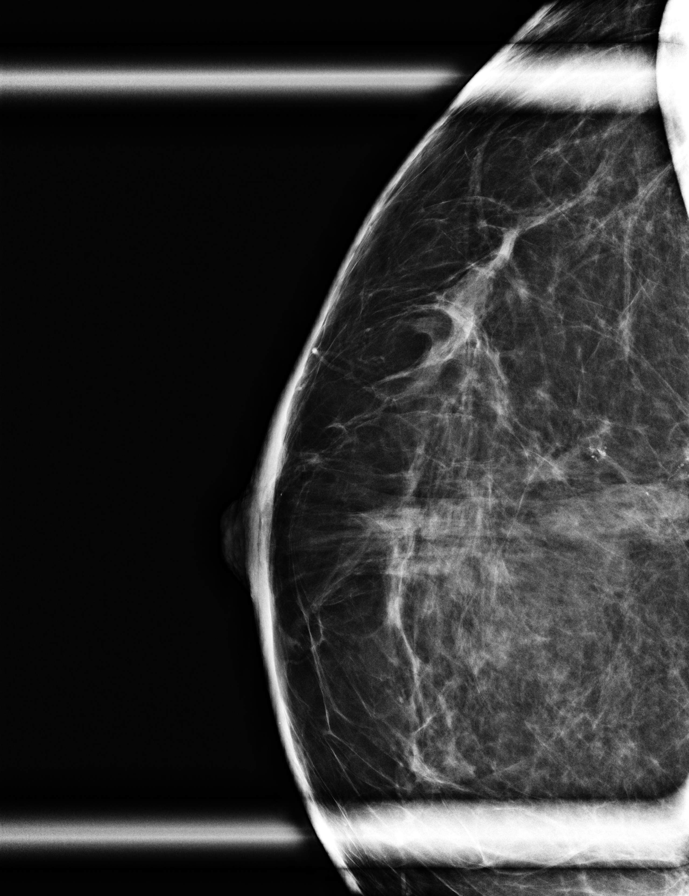

[R CC (3 of 3)]
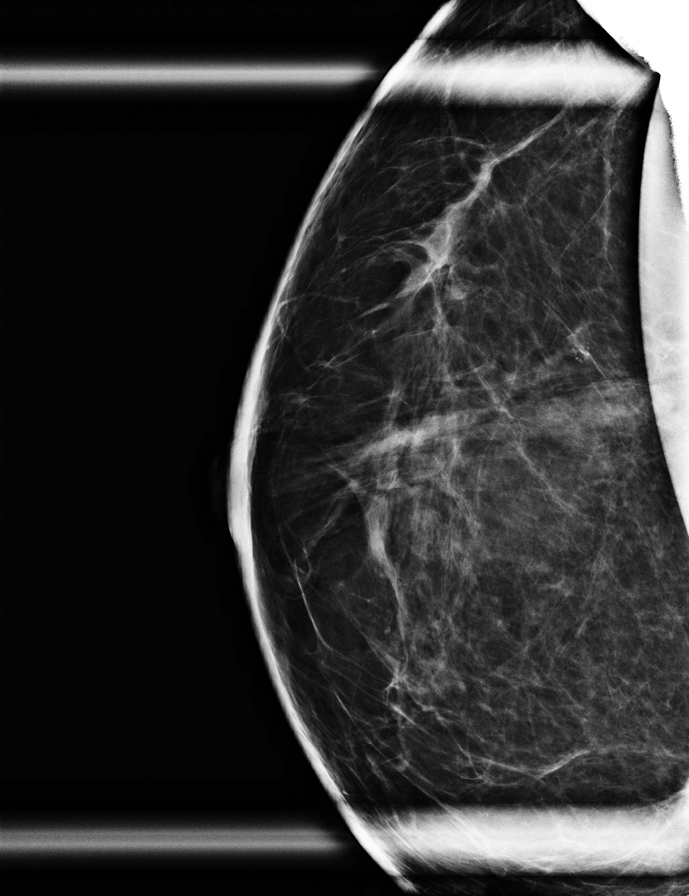

[R ML (2 of 2)]
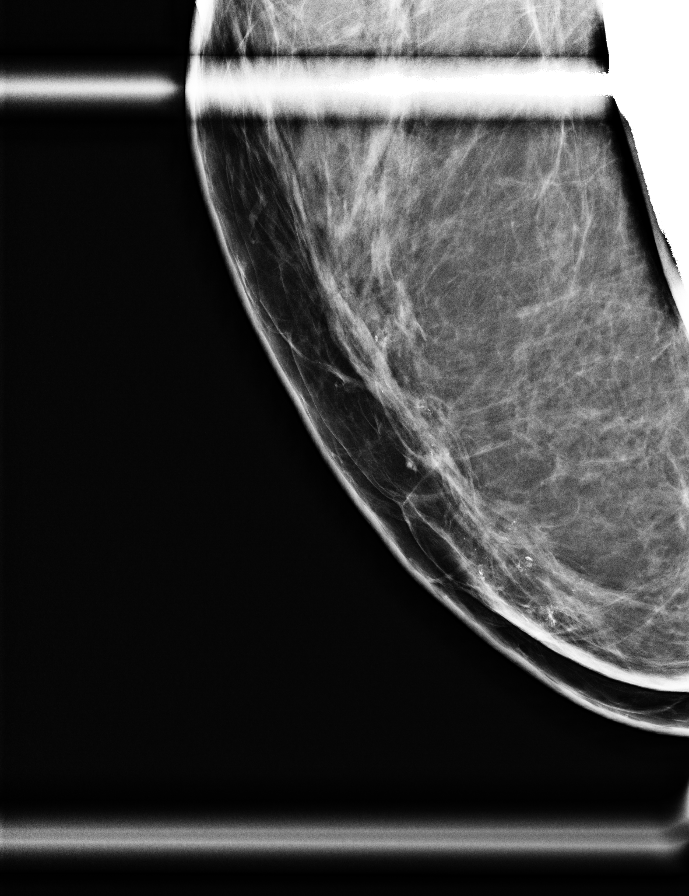

[R ML synth-2D]
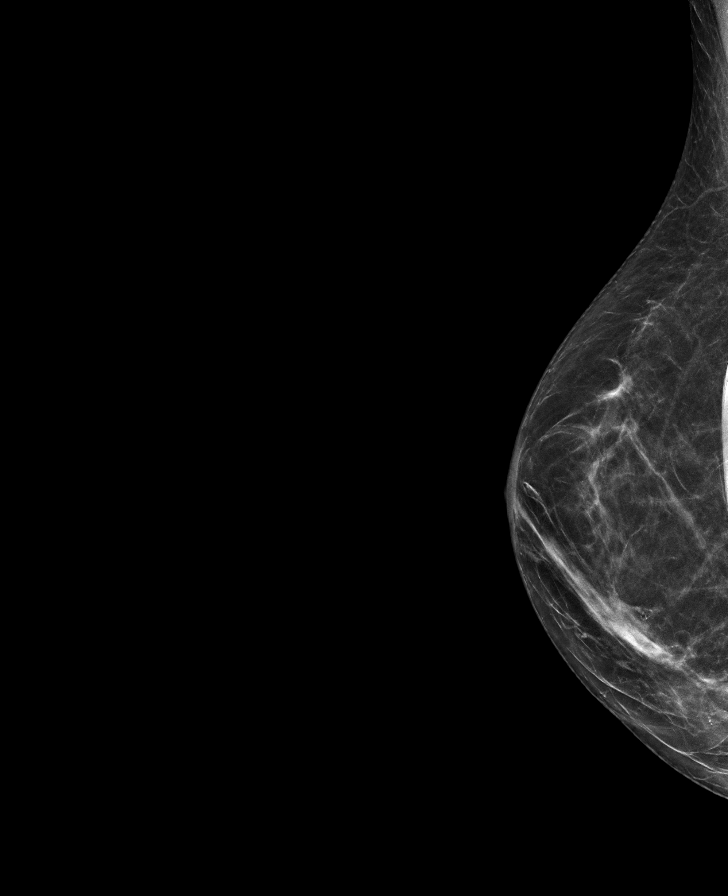

[R MLID BREAST TOMOSYNTHESIS IMAGE tomo · 2 of 59 frames shown]
[frame 20/59]
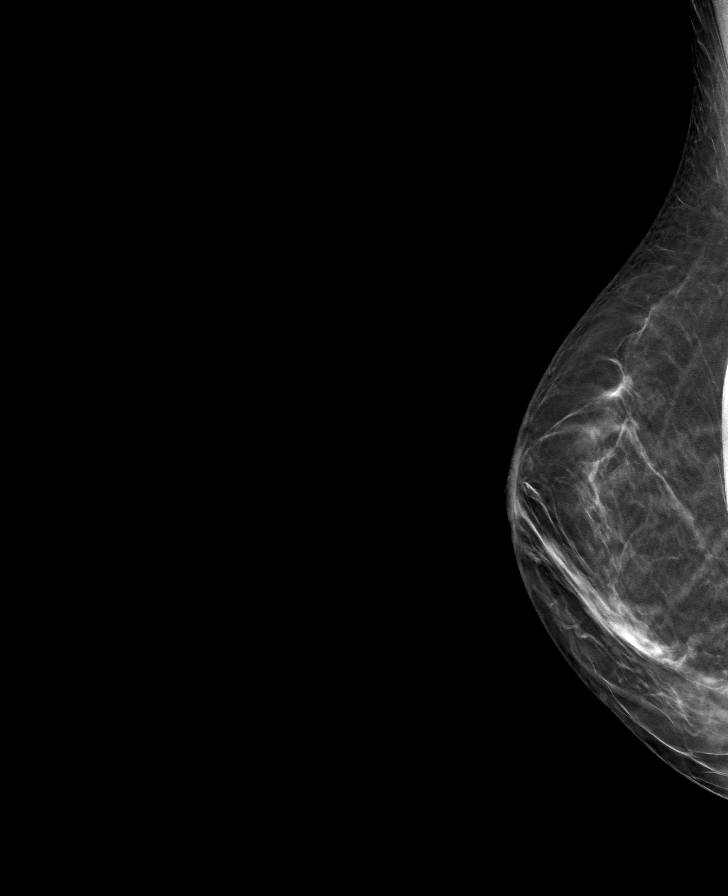
[frame 30/59]
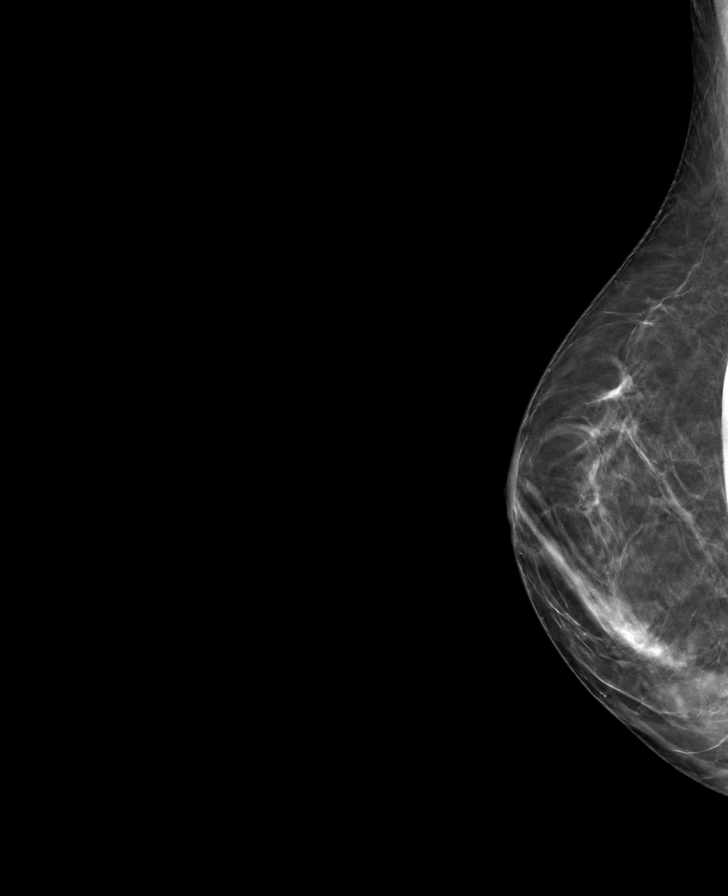

[8 of 11 positions shown; findings below may reference images not displayed]

ACR Breast Density Category b: There are scattered areas of
fibroglandular density.
FINDINGS: The patient has retropectoral implants.

Spot 2D magnification views and full field mL tomosynthesis views of
the right breast were performed. There are coarse calcifications
along the patient's surgical scar in the inferior right breast, most
likely related to fat necrosis. There is no associated mass or
distortion. The calcifications were likely present and are similar
to the previous year mammogram, though better characterized on
today's magnified views.
IMPRESSION: Probably benign calcifications in the inferior right breast, likely
postsurgical/fat necrosis.

RECOMMENDATION:
Diagnostic right breast mammogram in 6 months. If the calcifications
are stable at this time consider 1 additional six-month follow-up to
complete 2 year stability of the calcifications in the right breast.

I have discussed the findings and recommendations with the patient
who agrees to short-term follow-up. If applicable, a reminder letter
will be sent to the patient regarding the next appointment.

BI-RADS CATEGORY  3: Probably benign.

## 2024-01-02 ENCOUNTER — Ambulatory Visit

## 2024-01-02 DIAGNOSIS — D1801 Hemangioma of skin and subcutaneous tissue: Secondary | ICD-10-CM

## 2024-01-02 DIAGNOSIS — L578 Other skin changes due to chronic exposure to nonionizing radiation: Secondary | ICD-10-CM

## 2024-01-02 DIAGNOSIS — L820 Inflamed seborrheic keratosis: Secondary | ICD-10-CM | POA: Diagnosis not present

## 2024-01-02 DIAGNOSIS — L814 Other melanin hyperpigmentation: Secondary | ICD-10-CM | POA: Diagnosis not present

## 2024-01-02 DIAGNOSIS — L821 Other seborrheic keratosis: Secondary | ICD-10-CM

## 2024-01-02 DIAGNOSIS — W908XXA Exposure to other nonionizing radiation, initial encounter: Secondary | ICD-10-CM

## 2024-01-02 DIAGNOSIS — Z1283 Encounter for screening for malignant neoplasm of skin: Secondary | ICD-10-CM

## 2024-01-02 DIAGNOSIS — D229 Melanocytic nevi, unspecified: Secondary | ICD-10-CM

## 2024-01-02 NOTE — Progress Notes (Signed)
 Subjective   Ashley Hubbard is a 52 y.o. female who presents for the following: Total body skin exam for skin cancer screening and mole check. The patient has spots, moles and lesions to be evaluated, some may be new or changing and the patient may have concern these could be cancer. and Lesion(s) of concern . Patient is new patient  Today patient reports: Places at left cheek, left ear, left leg, and right forearm. No personal hx of skin cancer, hx family skin cancer- father.   Review of Systems:    No other skin or systemic complaints except as noted in HPI or Assessment and Plan.  The following portions of the chart were reviewed this encounter and updated as appropriate: medications, allergies, medical history  Relevant Medical History:  Family history of skin cancer - father (unknown type)   Objective  Well appearing patient in no apparent distress; mood and affect are within normal limits. Examination was performed of the: Full Skin Examination: scalp, head, eyes, ears, nose, lips, neck, chest, axillae, abdomen, back, buttocks, bilateral upper extremities, bilateral lower extremities, hands, feet, fingers, toes, fingernails, and toenails.   Examination notable for: Angioma(s): Scattered red vascular papule(s)  , Lentigo/lentigines: Scattered pigmented macules that are tan to brown in color and are somewhat non-uniform in shape and concentrated in the sun-exposed areas, Nevus/nevi: Scattered well-demarcated, regular, pigmented macule(s) and/or papule(s)  , Seborrheic Keratosis(es): Stuck-on appearing keratotic papule(s) on the trunk, some  irritated with redness, crusting, edema, and/or partial avulsion, Actinic Damage/Elastosis: chronic sun damage: dyspigmentation, telangiectasia, and wrinkling  Left cheek with 5 x 4 mm brown macule, photo taken today.  Examination limited by: Undergarments and Patient deferred removal       Right forearm x1 Stuck on waxy paps with  erythema  Assessment & Plan   BENIGN SKIN FINDINGS  - Lentigines  - Seborrheic keratoses  - Hemangiomas   - Nevus/Multiple Benign Nevi  - Reassurance provided regarding the benign appearance of lesions noted on exam today; no treatment is indicated in the absence of symptoms/changes. - Reinforced importance of photoprotective strategies including liberal and frequent sunscreen use of a broad-spectrum SPF 30 or greater, use of protective clothing, and sun avoidance for prevention of cutaneous malignancy and photoaging.  Counseled patient on the importance of regular self-skin monitoring as well as routine clinical skin examinations as scheduled.   ACTINIC DAMAGE - Chronic condition, secondary to cumulative UV/sun exposure - Recommend daily broad spectrum sunscreen SPF 30+ to sun-exposed areas, reapply every 2 hours as needed.  - Staying in the shade or wearing long sleeves, sun glasses (UVA+UVB protection) and wide brim hats (4-inch brim around the entire circumference of the hat) are also recommended for sun protection.  - Call for new or changing lesions.  Favor lentigo of L cheek - Exam as per above - Photodocumentation obtained - Will recheck in  6 mo   Level of service outlined above   Procedures, orders, diagnosis for this visit:  INFLAMED SEBORRHEIC KERATOSIS Right forearm x1 Symptomatic, irritating, patient would like treated. Destruction of lesion - Right forearm x1 Complexity: simple   Destruction method: cryotherapy   Informed consent: discussed and consent obtained   Timeout:  patient name, date of birth, surgical site, and procedure verified Lesion destroyed using liquid nitrogen: Yes   Region frozen until ice ball extended beyond lesion: Yes   Cryo cycles: 1 or 2. Outcome: patient tolerated procedure well with no complications   Post-procedure details: wound  care instructions given   Additional details:  Prior to procedure, discussed risks of blister formation,  small wound, skin dyspigmentation, or rare scar following cryotherapy. Recommend Vaseline ointment to treated areas while healing.    Inflamed seborrheic keratosis -     Destruction of lesion    Return to clinic: Return in about 6 months (around 07/02/2024) for recheck left cheek, 1 year TBSE , w/ Dr. Raymund.  Documentation: I, Jacquelynn V. Wilfred, CMA, am acting as scribe for Lauraine JAYSON Raymund, MD .  I have reviewed the above documentation for accuracy and completeness, and I agree with the above.  Lauraine JAYSON Raymund, MD

## 2024-01-02 NOTE — Patient Instructions (Addendum)
 Cryosurgery  Cryosurgery ("freezing") uses liquid nitrogen to destroy certain types of skin lesions. Lowering the temperature of the lesion in a small area surrounding skin destroys the lesion. Immediately following cryosurgery, you will notice redness and swelling of the treatment area. Blistering or weeping may occur, lasting approximately one week which will then be followed by crusting. Most areas will heal completely in 10 to 14 days.  Wash the treated areas daily. Allow soap and water to run over the areas, but do not scrub. Should a scab or crust form, allow it to fall off on its own. Do not remove or pick at it. Application of an ointment  and a bandage may make you feel more comfortable, but it is not necessary. Some people develop an allergy to Neosporin, so we recommend that Vaseline or  Aquaphor be used.  The cryotherapy site will be more sensitive than your surrounding skin. Keep it covered, and remember to apply sunscreen every day to all your sun exposed skin. A scar may remain which is lighter or pinker than your normal skin. Your body will continue to improve your scar for up to one year; however a light-colored scar may remain.  Infection following cryotherapy is rare. However if you are worried about the appearance of the treated area, contact your doctor. We have a physician on call at all times. If you have any concerns about the site, please call our clinic at [phone]    Sunscreen  Who needs sunscreen? Everyone. Sunscreen use can help prevent skin cancer by protecting you from the sun's harmful ultraviolet rays. Anyone can get skin cancer, regardless of age, gender or race. In fact, it is estimated that one in five Americans will develop skin cancer in their lifetime.  Sunscreen alone cannot fully protect you. In addition to wearing sunscreen, dermatologists recommend taking the following steps to protect your skin and find skin cancer early:  Seek shade when appropriate,  remembering that the sun's rays are strongest between 10 a.m. and 2 p.m. If your shadow is shorter than you are, seek shade. Dress to protect yourself from the sun by wearing a lightweight long-sleeved shirt, pants, a wide-brimmed hat and sunglasses, when possible.  Use extra caution near water, snow and sand as they reflect the damaging rays of the sun, which can increase your chance of sunburn.  Get vitamin D safely through a healthy diet that may include vitamin supplements. Don't seek the sun. Avoid tanning beds. Ultraviolet light from the sun and tanning beds can cause skin cancer and wrinkling. If you want to look tan, you may wish to use a self-tanning product, but continue to use sunscreen with it.  When should I use sunscreen? Every day you go outside--even if you're just walking to and from your form of transportation. The sun emits harmful UV rays year-round. Even on cloudy days, up to 80 percent of the sun's harmful UV rays can penetrate your skin. Snow, sand and water increase the need for sunscreen because they reflect the sun's rays.  How much sunscreen should I use, and how often should I apply it? Most people only apply 25-50 percent of the recommended amount of sunscreen. Apply enough sunscreen to cover all exposed skin. Most adults need about 1 ounce -- or enough to fill a shot glass -- to fully cover their body.  Don't forget to apply to the tops of your feet, your neck, your ears and the top of your head. Apply sunscreen to dry  skin 15 minutes before going outdoors.  Skin cancer also can form on the lips. To protect your lips, apply a lip balm or lipstick that contains sunscreen with an SPF of 30 or higher.  When outdoors, reapply sunscreen approximately every two hours, or after swimming or sweating, according to the directions on the bottle.   Broad-spectrum sunscreens protect against both UVA and UVB rays. What is the difference between the rays? Sunlight consists of two types  of harmful rays that reach the earth -- UVA rays and UVB rays. Overexposure to either can lead to skin cancer. In addition to causing skin cancer, here's what each of these rays do:  UVA rays (or aging rays) can prematurely age your skin, causing wrinkles and age spots, and can pass through window glass. UVB rays (or burning rays) are the primary cause of sunburn and are blocked by window glass  There is no safe way to tan. Every time you tan, you damage your skin. As this damage builds, you speed up the aging of your skin and increase your risk for all types of skin cancer.  What is the difference between chemical and physical sunscreens? Chemical sunscreens work like a sponge, absorbing the sun's rays. They contain one or more of the following active ingredients: oxybenzone, avobenzone, octisalate, octocrylene, homosalate and octinoxate. These formulations tend to be easier to rub into the skin without leaving a white residue.   Physical sunscreens work like a shield, sitting sit on the surface of your skin and deflecting the sun's rays. They contain the active ingredients zinc oxide and/or titanium dioxide. Use this sunscreen if you have sensitive skin.   What type of sunscreen should I use? The best type of sunscreen is the one you will use again and again. Just make sure it offers broad-spectrum (UVA and UVB) protection, has an SPF of 30+, and is water-resistant. The kind of sunscreen you use is a matter of personal choice, and may vary depending on the area of the body to be protected. Available sunscreen options include lotions, creams, gels, ointments, wax sticks and sprays.  Recommended physical sunscreens for face: - Neutrogena Sheer Zinc - Aveeno Positively Mineral Sensitive - CeraVe Hydrating Mineral (also has a tinted version) - La Roche-Posay Anthelios Mineral Face (comes as a cream, lotion, light fluid, and there is also a tinted version).  - EltaMD UV Clear (also has a tinted  version)  Recommended physical sunscreens for body: - Neutrogena Sheer Zinc Dry-Touch Sunscreen Sensitive Skin Lotion Broad Spectrum SPF 50 - Aveeno Positively Mineral Sensitive Skin Sunscreen Broad Spectrum SPF 50 - La Roche-Posay Anthelios SPF 50 Mineral Sunscreen - Gentle Lotion - CeraVe Hydrating Mineral Sunscreen SPF 50  Recommended chemical sunscreens for face: - Anthelios UV Correct Face Sunscreen SPF 70 with Niacinamide - Neutrogena Clear Face Oil-Free SPF 50 with Helioplex - Neutrogena Sport Face Oil-Free SPF 70+ with Helioplex - Aveeno Protect + Hydrate Sunscreen For Face SPF 70 - La Roche-Posay Anthelios Light Fluid Sunscreen for Face SPF 60  Recommended chemical sunscreens for body: - Neutrogena Ultra Sheer Dry-Touch Sunscreen SPF 70 - Aveeno Protect + Hydrate Broad Spectrum All-Day Hydration SPF 60 (comes in a big pump) - La Roche-Posay Anthelios Melt-In Milk Sunscreen SPF 60  Melanoma ABCDEs  Melanoma is the most dangerous type of skin cancer, and is the leading cause of death from skin disease.  You are more likely to develop melanoma if you: Have light-colored skin, light-colored eyes, or red or blond  hair Spend a lot of time in the sun Tan regularly, either outdoors or in a tanning bed Have had blistering sunburns, especially during childhood Have a close family member who has had a melanoma Have atypical moles or large birthmarks  Early detection of melanoma is key since treatment is typically straightforward and cure rates are extremely high if we catch it early.   The first sign of melanoma is often a change in a mole or a new dark spot.  The ABCDE system is a way of remembering the signs of melanoma.  A for asymmetry:  The two halves do not match. B for border:  The edges of the growth are irregular. C for color:  A mixture of colors are present instead of an even brown color. D for diameter:  Melanomas are usually (but not always) greater than 6mm - the size  of a pencil eraser. E for evolution:  The spot keeps changing in size, shape, and color.  Please check your skin once per month between visits. You can use a small mirror in front and a large mirror behind you to keep an eye on the back side or your body.   If you see any new or changing lesions before your next follow-up, please call to schedule a visit.  Please continue daily skin protection including broad spectrum sunscreen SPF 30+ to sun-exposed areas, reapplying every 2 hours as needed when you're outdoors.   Staying in the shade or wearing long sleeves, sun glasses (UVA+UVB protection) and wide brim hats (4-inch brim around the entire circumference of the hat) are also recommended for sun protection.   Due to recent changes in healthcare laws, you may see results of your pathology and/or laboratory studies on MyChart before the doctors have had a chance to review them. We understand that in some cases there may be results that are confusing or concerning to you. Please understand that not all results are received at the same time and often the doctors may need to interpret multiple results in order to provide you with the best plan of care or course of treatment. Therefore, we ask that you please give us  2 business days to thoroughly review all your results before contacting the office for clarification. Should we see a critical lab result, you will be contacted sooner.   If You Need Anything After Your Visit  If you have any questions or concerns for your doctor, please call our main line at [phone] and press option 4 to reach your doctor's medical assistant. If no one answers, please leave a voicemail as directed and we will return your call as soon as possible. Messages left after 4 pm will be answered the following business day.   You may also send us  a message via MyChart. We typically respond to MyChart messages within 1-2 business days.  For prescription refills, please ask  your pharmacy to contact our office. Our fax number is [phone].  If you have an urgent issue when the clinic is closed that cannot wait until the next business day, you can page your doctor at the number below.    Please note that while we do our best to be available for urgent issues outside of office hours, we are not available 24/7.   If you have an urgent issue and are unable to reach us , you may choose to seek medical care at your doctor's office, retail clinic, urgent care center, or emergency room.  If you have  a medical emergency, please immediately call 911 or go to the emergency department.  Pager Numbers  - Dr. Hester: [phone]  - Dr. Jackquline: [phone]  - Dr. Claudene: [phone]   In the event of inclement weather, please call our main line at [phone] for an update on the status of any delays or closures.  Dermatology Medication Tips: Please keep the boxes that topical medications come in in order to help keep track of the instructions about where and how to use these. Pharmacies typically print the medication instructions only on the boxes and not directly on the medication tubes.   If your medication is too expensive, please contact our office at [phone] option 4 or send us  a message through MyChart.   We are unable to tell what your co-pay for medications will be in advance as this is different depending on your insurance coverage. However, we may be able to find a substitute medication at lower cost or fill out paperwork to get insurance to cover a needed medication.   If a prior authorization is required to get your medication covered by your insurance company, please allow us  1-2 business days to complete this process.  Drug prices often vary depending on where the prescription is filled and some pharmacies may offer cheaper prices.  The website www.goodrx.com contains coupons for medications through different pharmacies. The prices here do not  account for what the cost may be with help from insurance (it may be cheaper with your insurance), but the website can give you the price if you did not use any insurance.  - You can print the associated coupon and take it with your prescription to the pharmacy.  - You may also stop by our office during regular business hours and pick up a GoodRx coupon card.  - If you need your prescription sent electronically to a different pharmacy, notify our office through [facility] or by phone at [phone] option 4.     Si Usted Necesita Algo Despus de Su Visita  Tambin puede enviarnos un mensaje a travs de Clinical cytogeneticist. Por lo general respondemos a los mensajes de MyChart en el transcurso de 1 a 2 das hbiles.  Para renovar recetas, por favor pida a su farmacia que se ponga en contacto con nuestra oficina. Randi lakes de fax es [place] [phone].  Si tiene un asunto urgente cuando la clnica est cerrada y que no puede esperar hasta el siguiente da hbil, puede llamar/localizar a su doctor(a) al nmero que aparece a continuacin.   Por favor, tenga en cuenta que aunque hacemos todo lo posible para estar disponibles para asuntos urgentes fuera del horario de [place], no estamos disponibles las 24 horas del da, los 7 [address]  [address] de la [place].   Si tiene un problema urgente y no puede comunicarse con nosotros, puede optar por buscar atencin mdica  en el consultorio de su doctor(a), en una clnica privada, en un centro de atencin urgente o en una sala de emergencias.  Si tiene Engineer, drilling, por favor llame inmediatamente al 911 o vaya a la sala de emergencias.  Nmeros de bper  - Dr. Hester: [phone]  - Dra. Jackquline: [phone]  - Dr. Claudene: [phone]   En caso de inclemencias del tiempo, por favor llame a landry capes principal al [phone] para una actualizacin sobre el [place] de cualquier retraso o cierre.  Consejos para la medicacin en dermatologa: Por  favor, guarde las cajas en las que vienen los medicamentos de [place]  tpico para ayudarle a seguir las instrucciones sobre dnde y cmo usarlos. Las farmacias generalmente imprimen las instrucciones del medicamento slo en las cajas y no directamente en los tubos del [place].   Si su medicamento es muy caro, por favor, pngase en contacto con landry rieger llamando al [phone] y presione la opcin 4 o envenos un mensaje a travs de Clinical cytogeneticist.   No podemos decirle cul ser su copago por los medicamentos por adelantado ya que esto es diferente dependiendo de la cobertura de su seguro. Sin embargo, es posible que podamos encontrar un medicamento sustituto a Audiological scientist un formulario para que el seguro cubra el medicamento que se considera necesario.   Si se requiere una autorizacin previa para que su compaa de seguros malta su medicamento, por favor permtanos de 1 a 2 das hbiles para completar este proceso.  Los precios de los medicamentos varan con frecuencia dependiendo del Environmental consultant de dnde se surte la receta y alguna farmacias pueden ofrecer precios ms baratos.  El sitio web www.goodrx.com tiene cupones para medicamentos de Health and safety inspector. Los precios aqu no tienen en cuenta lo que podra costar con la ayuda del seguro (puede ser ms barato con su seguro), pero el sitio web puede darle el precio si no utiliz Tourist information centre manager.  - Puede imprimir el cupn correspondiente y llevarlo con su receta a la farmacia.  - Tambin puede pasar por nuestra oficina durante el horario de atencin regular y Education officer, museum una tarjeta de cupones de GoodRx.  - Si necesita que su receta se enve electrnicamente a una farmacia diferente, informe a nuestra oficina a travs de MyChart de [place] o por telfono llamando al [phone] y presione la opcin 4.

## 2024-07-02 ENCOUNTER — Ambulatory Visit

## 2025-01-01 ENCOUNTER — Ambulatory Visit
